# Patient Record
Sex: Female | Born: 1995 | Race: Asian | Hispanic: No | Marital: Married | Smoking: Never smoker
Health system: Southern US, Community
[De-identification: ages and names within clinical notes are randomized; demographics above are authoritative.]

## PROBLEM LIST (undated history)

## (undated) DIAGNOSIS — J45909 Unspecified asthma, uncomplicated: Secondary | ICD-10-CM

---
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
## 2017-08-21 ENCOUNTER — Emergency Department
Admission: EM | Admit: 2017-08-21 | Discharge: 2017-08-21 | Disposition: A | Discharge status: Home / Self Care | Payer: 59 | Source: Home / Self Care | Attending: Family Medicine | Admitting: Family Medicine

## 2017-08-21 ENCOUNTER — Other Ambulatory Visit: Payer: Self-pay

## 2017-08-21 ENCOUNTER — Encounter: Payer: Self-pay | Admitting: Emergency Medicine

## 2017-08-21 DIAGNOSIS — J069 Acute upper respiratory infection, unspecified: Secondary | ICD-10-CM

## 2017-08-21 DIAGNOSIS — B9789 Other viral agents as the cause of diseases classified elsewhere: Secondary | ICD-10-CM

## 2017-08-21 DIAGNOSIS — J0100 Acute maxillary sinusitis, unspecified: Secondary | ICD-10-CM | POA: Diagnosis not present

## 2017-08-21 MED ORDER — AMOXICILLIN 875 MG PO TABS: 875.0000 mg | ORAL_TABLET | Freq: Two times a day (BID) | ORAL | 0 refills | Status: DC

## 2017-08-21 MED ORDER — BENZONATATE 200 MG PO CAPS: ORAL_CAPSULE | ORAL | 0 refills | Status: AC

## 2017-08-21 MED ORDER — PREDNISONE 20 MG PO TABS: ORAL_TABLET | ORAL | 0 refills | Status: DC

## 2017-08-21 NOTE — ED Triage Notes (Signed)
Patient presents to [facility] with a complaint of Cough and Congestion

## 2017-08-21 NOTE — ED Provider Notes (Signed)
[facility]    CSN: [id number] Arrival date & time: 08/21/17  1208     History   Chief Complaint Chief Complaint  Patient presents with  . Cough    HPI Abigail Bridges is a 22 y.o. female.   About 13 days ago patient developed typical cold-like symptoms developing over several days, including mild sore throat, sinus congestion, headache, fatigue, and cough.  Patient complains of persistent sinus pressure and chills/sweats. She has a past history of exercise asthma, seasonal rhinitis, and past frequent otitis media as a child.   The history is provided by the patient.    History reviewed. No pertinent past medical history.  There are no active problems to display for this patient.   History reviewed. No pertinent surgical history.  OB History    No data available       Home Medications    Prior to Admission medications   Medication Sig Start Date End Date Taking? Authorizing Provider  amoxicillin (AMOXIL) 875 MG tablet Take 1 tablet (875 mg total) by mouth 2 (two) times daily. 08/21/17   Lattie Haw, MD  benzonatate (TESSALON) 200 MG capsule Take one cap by mouth at bedtime as needed for cough.  May repeat in 4 to 6 hours 08/21/17   Lattie Haw, MD  predniSONE (DELTASONE) 20 MG tablet Take one tab by mouth twice daily for 4 days, then one daily for 3 days. Take with food. 08/21/17   Lattie Haw, MD    Family History No family history on file.  Social History Social History   Tobacco Use  . Smoking status: Never Smoker  . Smokeless tobacco: Never Used  Substance Use Topics  . Alcohol use: Not on file  . Drug use: Not on file     Allergies   Patient has no known allergies.   Review of Systems Review of Systems + sore throat + cough No pleuritic pain but feels tight in anterior chest No wheezing + nasal congestion + post-nasal drainage + sinus pain/pressure No itchy/red eyes No earache No hemoptysis No SOB No  fever, + chills/sweats No nausea No vomiting No abdominal pain No diarrhea No urinary symptoms No skin rash + fatigue + myalgias + headache Used OTC meds without relief   Physical Exam Triage Vital Signs ED Triage Vitals  Enc Vitals Group     BP 08/21/17 1238 122/80     Pulse Rate 08/21/17 1238 100     Resp --      Temp 08/21/17 1238 98.7 F (37.1 C)     Temp Source 08/21/17 1238 Oral     SpO2 08/21/17 1238 99 %     Weight --      Height 08/21/17 1238 4\' 11"  (1.499 m)     Head Circumference --      Peak Flow --      Pain Score 08/21/17 1239 7     Pain Loc --      Pain Edu? --      Excl. in GC? --    No data found.  Updated Vital Signs BP 122/80 (BP Location: Left Arm)   Pulse 100   Temp 98.7 F (37.1 C) (Oral)   Ht 4\' 11"  (1.499 m)   SpO2 99%   Visual Acuity Right Eye Distance:   Left Eye Distance:   Bilateral Distance:    Right Eye Near:   Left Eye Near:    Bilateral Near:  Physical Exam Nursing notes and Vital Signs reviewed. Appearance:  Patient appears stated age, and in no acute distress Eyes:  Pupils are equal, round, and reactive to light and accomodation.  Extraocular movement is intact.  Conjunctivae are not inflamed  Ears:  Right canal normal but partly occluded with cerumen.  Visualized portion of right tympanic membrane appears to have serous effusion.  Left canal and tympanic membrane normal. Nose:  Congested turbinates.   Right maxillary sinus tenderness is present.  Pharynx:  Normal Neck:  Supple.  Enlarged posterior/lateral nodes are palpated bilaterally, tender to palpation on the left.   Lungs:  Clear to auscultation.  Breath sounds are equal.  Moving air well. Heart:  Regular rate and rhythm without murmurs, rubs, or gallops.  Abdomen:  Nontender without masses or hepatosplenomegaly.  Bowel sounds are present.  No CVA or flank tenderness.  Extremities:  No edema.  Skin:  No rash present.    UC Treatments / Results  Labs (all  labs ordered are listed, but only abnormal results are displayed) Labs Reviewed - No data to display  EKG  EKG Interpretation None       Radiology No results found.  Procedures Procedures (including critical care time)  Medications Ordered in UC Medications - No data to display   Initial Impression / Assessment and Plan / UC Course  I have reviewed the triage vital signs and the nursing notes.  Pertinent labs & imaging results that were available during my care of the patient were reviewed by me and considered in my medical decision making (see chart for details).    Begin amoxicillin, and prednisone burst/taper. Prescription written for Benzonatate [facility](Tessalon) to take at bedtime for night-time cough.  Take plain guaifenesin (1200mg  extended release tabs such as Mucinex) twice daily, with plenty of water, for cough and congestion.  May add Pseudoephedrine (30mg , one or two every 4 to 6 hours) for sinus congestion.  Get adequate rest.   May use Afrin nasal spray (or generic oxymetazoline) each morning for about 5 days and then discontinue.  Also recommend using saline nasal spray several times daily and saline nasal irrigation (AYR is a common brand).  Use Flonase nasal spray each morning after using Afrin nasal spray and saline nasal irrigation. Try warm salt water gargles for sore throat.  Stop all antihistamines for now, and other non-prescription cough/cold preparations. Followup with Family Doctor if not improved in one week.     Final Clinical Impressions(s) / UC Diagnoses   Final diagnoses:  Acute maxillary sinusitis, recurrence not specified  Viral URI with cough    ED Discharge Orders        Ordered    amoxicillin (AMOXIL) 875 MG tablet  2 times daily     08/21/17 1255    predniSONE (DELTASONE) 20 MG tablet     08/21/17 1255    benzonatate (TESSALON) 200 MG capsule     08/21/17 1255          Lattie HawBeese, Malosi Hemstreet A, MD 08/27/17 1449

## 2017-08-21 NOTE — Discharge Instructions (Signed)
Take plain guaifenesin (1200mg extended release tabs such as Mucinex) twice daily, with plenty of water, for cough and congestion.  May add Pseudoephedrine (30mg, one or two every 4 to 6 hours) for sinus congestion.  Get adequate rest.   °May use Afrin nasal spray (or generic oxymetazoline) each morning for about 5 days and then discontinue.  Also recommend using saline nasal spray several times daily and saline nasal irrigation (AYR is a common brand).  Use Flonase nasal spray each morning after using Afrin nasal spray and saline nasal irrigation. °Try warm salt water gargles for sore throat.  °Stop all antihistamines for now, and other non-prescription cough/cold preparations. °  °  °

## 2019-10-13 ENCOUNTER — Emergency Department (HOSPITAL_COMMUNITY): Payer: Self-pay

## 2019-10-13 ENCOUNTER — Emergency Department (HOSPITAL_COMMUNITY)
Admission: EM | Admit: 2019-10-13 | Discharge: 2019-10-13 | Disposition: A | Discharge status: Home / Self Care | Payer: Self-pay | Attending: Emergency Medicine | Admitting: Emergency Medicine

## 2019-10-13 ENCOUNTER — Ambulatory Visit (HOSPITAL_COMMUNITY)
Admission: EM | Admit: 2019-10-13 | Discharge: 2019-10-13 | Disposition: A | Discharge status: Home / Self Care | Payer: Self-pay | Attending: Family Medicine | Admitting: Family Medicine

## 2019-10-13 ENCOUNTER — Ambulatory Visit (INDEPENDENT_AMBULATORY_CARE_PROVIDER_SITE_OTHER): Payer: Self-pay

## 2019-10-13 ENCOUNTER — Emergency Department (HOSPITAL_BASED_OUTPATIENT_CLINIC_OR_DEPARTMENT_OTHER): Payer: Self-pay

## 2019-10-13 ENCOUNTER — Encounter (HOSPITAL_COMMUNITY): Payer: Self-pay | Admitting: Family Medicine

## 2019-10-13 ENCOUNTER — Encounter (HOSPITAL_COMMUNITY): Payer: Self-pay | Admitting: Emergency Medicine

## 2019-10-13 ENCOUNTER — Other Ambulatory Visit: Payer: Self-pay

## 2019-10-13 DIAGNOSIS — R0789 Other chest pain: Secondary | ICD-10-CM

## 2019-10-13 DIAGNOSIS — R0602 Shortness of breath: Secondary | ICD-10-CM | POA: Insufficient documentation

## 2019-10-13 DIAGNOSIS — R609 Edema, unspecified: Secondary | ICD-10-CM

## 2019-10-13 DIAGNOSIS — J45909 Unspecified asthma, uncomplicated: Secondary | ICD-10-CM | POA: Insufficient documentation

## 2019-10-13 HISTORY — DX: Unspecified asthma, uncomplicated: J45.909

## 2019-10-13 LAB — CBC
HCT: 46.4 % — ABNORMAL HIGH (ref 36.0–46.0)
Hemoglobin: 15.1 g/dL — ABNORMAL HIGH (ref 12.0–15.0)
MCH: 27.7 pg (ref 26.0–34.0)
MCHC: 32.5 g/dL (ref 30.0–36.0)
MCV: 85 fL (ref 80.0–100.0)
Platelets: 480 10*3/uL — ABNORMAL HIGH (ref 150–400)
RBC: 5.46 MIL/uL — ABNORMAL HIGH (ref 3.87–5.11)
RDW: 12.6 % (ref 11.5–15.5)
WBC: 7.7 10*3/uL (ref 4.0–10.5)
nRBC: 0 % (ref 0.0–0.2)

## 2019-10-13 LAB — BASIC METABOLIC PANEL
Anion gap: 13 (ref 5–15)
BUN: 10 mg/dL (ref 6–20)
CO2: 24 mmol/L (ref 22–32)
Calcium: 9.2 mg/dL (ref 8.9–10.3)
Chloride: 104 mmol/L (ref 98–111)
Creatinine, Ser: 0.66 mg/dL (ref 0.44–1.00)
GFR calc Af Amer: >60 mL/min (ref >60)
GFR calc non Af Amer: >60 mL/min (ref >60)
Glucose, Bld: 90 mg/dL (ref 70–99)
Potassium: 4 mmol/L (ref 3.5–5.1)
Sodium: 141 mmol/L (ref 135–145)

## 2019-10-13 LAB — I-STAT BETA HCG BLOOD, ED (MC, WL, AP ONLY): I-stat hCG, quantitative: <5 m[IU]/mL (ref <5)

## 2019-10-13 LAB — TROPONIN I (HIGH SENSITIVITY)
Troponin I (High Sensitivity): <2 ng/L (ref <18)
Troponin I (High Sensitivity): 3 ng/L (ref <18)

## 2019-10-13 MED ORDER — SODIUM CHLORIDE 0.9% FLUSH: 3.0000 mL | Freq: Once | INTRAVENOUS | Status: DC

## 2019-10-13 MED ORDER — AEROCHAMBER PLUS FLO-VU LARGE MISC
1.0000 | Freq: Once | Status: AC
  Administered 2019-10-13: 1

## 2019-10-13 MED ORDER — ALBUTEROL SULFATE HFA 108 (90 BASE) MCG/ACT IN AERS
2.0000 | INHALATION_SPRAY | Freq: Once | RESPIRATORY_TRACT | Status: AC
  Administered 2019-10-13: 2 via RESPIRATORY_TRACT
  Filled 2019-10-13: qty 6.7

## 2019-10-13 NOTE — ED Provider Notes (Signed)
[facility] Provider Note   CSN: [id number] Arrival date & time: 10/13/19  1020     History Chief Complaint  Patient presents with  . Chest Pain  . Shortness of Breath    Abigail RainwaterCarmen Daisy Bridges is a 24 y.o. female with past medical history of anemia, who presents today for evaluation of left-sided chest pain. She reports that initially when she woke up at about 730 this morning she was feeling okay however when she went to get up out of bed she had sudden onset of severe left-sided chest pain with nausea.  She reports that it hurts whenever she breathes therefore she was taking small shallow breaths however denies any difficulty getting air in or out. She denies any recent trauma.  She does not take any hormones or birth control.  She does report that occasionally her right ankle swells which is been relatively new for her.  She denies any recent new activities.  She reports that she tested positive for Covid in November and has fully recovered since then.  She denies any history of DVT/PE.  She initially went to urgent care where they were reportedly concerned about a blood clot and referred her here.    HPI     Past Medical History:  Diagnosis Date  . Anemia 2014  . Asthma     There are no problems to display for this patient.   History reviewed. No pertinent surgical history.   OB History   No obstetric history on file.     Family History  Problem Relation Age of Onset  . Healthy Mother   . Healthy Father     Social History   Tobacco Use  . Smoking status: Never Smoker  . Smokeless tobacco: Never Used  Substance Use Topics  . Alcohol use: Yes    Comment: occasional   . Drug use: Never    Home Medications Prior to Admission medications   Medication Sig Start Date End Date Taking? Authorizing Provider  albuterol (VENTOLIN HFA) 108 (90 Base) MCG/ACT inhaler Inhale 2 puffs into the lungs every 6 (six) hours as needed  for wheezing or shortness of breath.    [provider]  benzonatate (TESSALON) 200 MG capsule Take one cap by mouth at bedtime as needed for cough.  May repeat in 4 to 6 hours 08/21/17   Lattie HawBeese, Stephen A, MD    Allergies    Prednisone  Review of Systems   Review of Systems  Constitutional: Negative for chills, fatigue and fever.  HENT: Negative for congestion.   Eyes: Negative for visual disturbance.  Respiratory: Positive for cough. Negative for shortness of breath (Chest pain with breathing).   Cardiovascular: Positive for chest pain and leg swelling. Negative for palpitations.  Gastrointestinal: Positive for nausea. Negative for abdominal pain, diarrhea and vomiting.  Genitourinary: Negative for dysuria and urgency.  Musculoskeletal: Negative for back pain and neck pain.  Neurological: Positive for headaches. Negative for weakness and light-headedness.  All other systems reviewed and are negative.   Physical Exam Updated Vital Signs BP 105/77   Pulse 86   Temp 99 F (37.2 C) (Oral)   Resp 18   Ht 4\' 11"  (1.499 m)   Wt 86.2 kg   SpO2 98%   BMI 38.38 kg/m   Physical Exam Vitals and nursing note reviewed.  Constitutional:      General: She is not in acute distress.    Appearance: She is well-developed.  HENT:     Head: Normocephalic and atraumatic.  Eyes:     Conjunctiva/sclera: Conjunctivae normal.  Cardiovascular:     Rate and Rhythm: Normal rate and regular rhythm.     Pulses:          Radial pulses are 2+ on the right side and 2+ on the left side.       Dorsalis pedis pulses are 2+ on the right side and 2+ on the left side.       Posterior tibial pulses are 2+ on the right side and 2+ on the left side.     Heart sounds: Normal heart sounds. No murmur.  Pulmonary:     Effort: Pulmonary effort is normal. No respiratory distress.     Breath sounds: Normal breath sounds.  Chest:     Chest wall: Tenderness (There is generalized tenderness to palpation  over the left anterior chest. Palpation here both recreates and exacerbates her reported pain. Additionally she has tenderness to palpation over the left-sided superior aspect of the trapezius muscle.  ) present.     Comments: Chest pain is recreated and exacerbated with abduction, external rotation of the left arm. Abdominal:     Palpations: Abdomen is soft.     Tenderness: There is no abdominal tenderness.  Musculoskeletal:     Cervical back: Normal range of motion and neck supple.     Right lower leg: No tenderness. Edema (Trace around right ankle) present.     Left lower leg: No tenderness. No edema.  Skin:    General: Skin is warm and dry.  Neurological:     General: No focal deficit present.     Mental Status: She is alert.  Psychiatric:        Mood and Affect: Mood normal.        Behavior: Behavior normal.     ED Results / Procedures / Treatments   Labs (all labs ordered are listed, but only abnormal results are displayed) Labs Reviewed  CBC - Abnormal; Notable for the following components:      Result Value   RBC 5.46 (*)    Hemoglobin 15.1 (*)    HCT 46.4 (*)    Platelets 480 (*)    All other components within normal limits  BASIC METABOLIC PANEL  I-STAT BETA HCG BLOOD, ED ([facility], [facility], [facility] ONLY)  TROPONIN I (HIGH SENSITIVITY)  TROPONIN I (HIGH SENSITIVITY)    EKG None  Radiology DG Chest 2 View  Result Date: 10/13/2019 CLINICAL DATA:  Chest pain, shortness of breath EXAM: CHEST - 2 VIEW COMPARISON:  10/13/2019, 9:41 a.m. FINDINGS: The heart size and mediastinal contours are within normal limits. Both lungs are clear. The visualized skeletal structures are unremarkable. IMPRESSION: No acute abnormality of the lungs. There is no airspace abnormality of the left upper lobe underlying hair braids seen on prior examination. Electronically Signed   By: Lauralyn Primes M.D.   On: 10/13/2019 11:07   DG Chest 2 View  Addendum Date: 10/13/2019   ADDENDUM REPORT: 10/13/2019 10:01  ADDENDUM: Repeat frontal view with hair braids moved shows lungs clear bilaterally. Electronically Signed   By: Bretta Bang III M.D.   On: 10/13/2019 10:01   Result Date: 10/13/2019 CLINICAL DATA:  Chest pain EXAM: CHEST - 2 VIEW COMPARISON:  None. FINDINGS: Apparent air braids are noted on the left which extend over a portion of the left upper chest. It is difficult to exclude patchy infiltrate in this area, although  given normal lateral view, the opacity in the left upper lobe is likely artifactual as opposed to arising from within the lung parenchyma. Lungs elsewhere clear. Heart size and pulmonary vascularity are normal. No adenopathy. No bone lesions. IMPRESSION: Suspect artifact overlying the left upper lobe; it may be prudent to repeat frontal view after moving hair braids from left side. Lungs elsewhere clear. Cardiac silhouette normal. No adenopathy. These results will be called to the ordering clinician or representative by the Radiologist Assistant, and communication documented in the PACS or [employer]. Electronically Signed: By: Bretta Bang III M.D. On: 10/13/2019 09:48   VAS [place] LOWER EXTREMITY VENOUS (DVT) (ONLY [facility] & [facility] 7a-7p)  Result Date: 10/13/2019  Lower Venous DVTStudy Indications: Edema.  Comparison Study: no prior Performing Technologist: Blanch Media RVS  Examination Guidelines: A complete evaluation includes B-mode imaging, spectral Doppler, color Doppler, and power Doppler as needed of all accessible portions of each vessel. Bilateral testing is considered an integral part of a complete examination. Limited examinations for reoccurring indications may be performed as noted. The reflux portion of the exam is performed with the patient in reverse Trendelenburg.  +---------+---------------+---------+-----------+----------+--------------+ RIGHT    CompressibilityPhasicitySpontaneityPropertiesThrombus Aging  +---------+---------------+---------+-----------+----------+--------------+ CFV      Full           Yes      Yes                                 +---------+---------------+---------+-----------+----------+--------------+ SFJ      Full                                                        +---------+---------------+---------+-----------+----------+--------------+ FV Prox  Full                                                        +---------+---------------+---------+-----------+----------+--------------+ FV Mid   Full                                                        +---------+---------------+---------+-----------+----------+--------------+ FV DistalFull                                                        +---------+---------------+---------+-----------+----------+--------------+ PFV      Full                                                        +---------+---------------+---------+-----------+----------+--------------+ POP      Full           Yes      Yes                                 +---------+---------------+---------+-----------+----------+--------------+  PTV      Full                                                        +---------+---------------+---------+-----------+----------+--------------+ PERO     Full                                                        +---------+---------------+---------+-----------+----------+--------------+   +----+---------------+---------+-----------+----------+--------------+ LEFTCompressibilityPhasicitySpontaneityPropertiesThrombus Aging +----+---------------+---------+-----------+----------+--------------+ CFV Full           Yes      Yes                                 +----+---------------+---------+-----------+----------+--------------+     Summary: RIGHT: - There is no evidence of deep vein thrombosis in the lower extremity.  - No cystic structure found in the popliteal fossa.   LEFT: - No evidence of common femoral vein obstruction.  *See table(s) above for measurements and observations.    Preliminary     Procedures Procedures (including critical care time)  Medications Ordered in ED Medications  sodium chloride flush (NS) 0.9 % injection 3 mL (3 mLs Intravenous Not Given 10/13/19 1237)  albuterol (VENTOLIN HFA) 108 (90 Base) MCG/ACT inhaler 2 puff (2 puffs Inhalation Given 10/13/19 1235)  AeroChamber Plus Flo-Vu Large MISC 1 each (1 each Other Given 10/13/19 1236)    ED Course  I have reviewed the triage vital signs and the nursing notes.  Pertinent labs & imaging results that were available during my care of the patient were reviewed by me and considered in my medical decision making (see chart for details).  Clinical Course as of Oct 12 1437  Fri Oct 13, 2019  1434 Patient reevaluated, she reports the albuterol "helped a little bit." No changes in symptoms otherwise   [EH]    Clinical Course User Index [EH] Ollen Gross   MDM Rules/Calculators/A&P                     Patient is a 24 year old woman who is overall healthy who presents today from urgent care for concern of chest pain and possible DVT/PE. On my exam she is resting comfortably in no obvious distress. She is not consistently tachycardic or tachypneic. She does report occasional ankle swelling and there is questionable/trace edema around the right ankle. DVT study was ordered without evidence of right leg DVT. Given that she is otherwise PERC negative no indication for further evaluation of VTE at this time especially given low clinical suspicion. She does not take any hormones. She did have Covid in November however has fully recovered since then. Troponin x2 obtained without evidence of ischemia. EKG without evidence of ischemia or acute arrhythmia. She was given albuterol as she has previous prescription for that at home which provided minimal relief. Her pain is easily recreated  and exacerbated with left arm motion and palpation of the left anterior chest wall. I suspect that her chest pain is musculoskeletal in origin. Chest x-ray without evidence of consolidation, pneumothorax, or other acute abnormalities. She was observed in the emergency  room for 4 hours without significant change in condition. Recommended OTC ibuprofen and Tylenol as needed for symptoms. She does appear minimally dehydrated and we discussed the importance of p.o. hydration.  Return precautions were discussed with patient who states their understanding.  At the time of discharge patient denied any unaddressed complaints or concerns.  Patient is agreeable for discharge home.  Note: Portions of this report may have been transcribed using voice recognition software. Every effort was made to ensure accuracy; however, inadvertent computerized transcription errors may be present  Final Clinical Impression(s) / ED Diagnoses Final diagnoses:  Atypical chest pain    Rx / DC Orders ED Discharge Orders    None       Norman Clay 10/13/19 1940    Benjiman Core, MD 10/16/19 [phone]

## 2019-10-13 NOTE — ED Triage Notes (Signed)
Pt presents with c/o sharp L CP radiating to left shoulder, which woke her up at 0730.  Denies SOB, dizziness, nausea.  States pain is worse when pressing on chest wall and with deep inspiration.

## 2019-10-13 NOTE — ED Notes (Signed)
Ultrasound currently at bedside.

## 2019-10-13 NOTE — ED Notes (Signed)
Patient is being discharged from the Urgent Care Center and sent to the Emergency Department via wheelchair by staff. Per Dr. Milus Glazier, patient is stable but in need of higher level of care due to chest pain. Patient is aware and verbalizes understanding of plan of care.  Vitals:   10/13/19 0918  BP: 105/61  Pulse: 90  Resp: 20  Temp: 98.7 F (37.1 C)  SpO2: 100%

## 2019-10-13 NOTE — Progress Notes (Signed)
Lower extremity venous has been completed.   Preliminary results in CV Proc.   Blanch Media 10/13/2019 1:24 PM

## 2019-10-13 NOTE — ED Notes (Signed)
ED Provider at bedside. 

## 2019-10-13 NOTE — Discharge Instructions (Addendum)
Because of the possibility that this pain could be from a clot in the lung, I am sending you down to the emergency department for further testing

## 2019-10-13 NOTE — ED Provider Notes (Signed)
[facility]    CSN: [id number] Arrival date & time: 10/13/19  0847      History   Chief Complaint Chief Complaint  Patient presents with  . Chest Pain    HPI Abigail Bridges is a 24 y.o. female.   Patient initial Abigail Bridges urgent care visit for this 24 year old woman who presents with left-sided chest pain.  She states that about 730 this morning she suddenly felt anterior left upper chest pain without shortness of breath.  The pain has intensified somewhat.  It is worse with taking a deep breath.  It is also worse when pressing on that upper left anterior section of her chest.  Patient is never had this before     Past Medical History:  Diagnosis Date  . Anemia 2014  . Asthma     There are no problems to display for this patient.   History reviewed. No pertinent surgical history.  OB History   No obstetric history on file.      Home Medications    Prior to Admission medications   Medication Sig Start Date End Date Taking? Authorizing Provider  albuterol (VENTOLIN HFA) 108 (90 Base) MCG/ACT inhaler Inhale 2 puffs into the lungs every 6 (six) hours as needed for wheezing or shortness of breath.   Yes [provider]  benzonatate (TESSALON) 200 MG capsule Take one cap by mouth at bedtime as needed for cough.  May repeat in 4 to 6 hours 08/21/17   Lattie Haw, MD    Family History Family History  Problem Relation Age of Onset  . Healthy Mother   . Healthy Father     Social History Social History   Tobacco Use  . Smoking status: Never Smoker  . Smokeless tobacco: Never Used  Substance Use Topics  . Alcohol use: Yes    Comment: occasional   . Drug use: Never     Allergies   Prednisone   Review of Systems Review of Systems   Physical Exam Triage Vital Signs ED Triage Vitals [10/13/19 0910]  Enc Vitals Group     BP      Pulse      Resp      Temp      Temp src      SpO2      Weight      Height      Head  Circumference      Peak Flow      Pain Score 10     Pain Loc      Pain Edu?      Excl. in GC?    No data found.  Updated Vital Signs BP 105/61 (BP Location: Right Arm)   Pulse 90   Temp 98.7 F (37.1 C) (Oral)   Resp 20   LMP 10/05/2018 Comment: patient stated she doesn't have periods  SpO2 100%    Physical Exam Vitals and nursing note reviewed.  Constitutional:      General: She is in acute distress.     Appearance: She is well-developed. She is obese.  HENT:     Head: Normocephalic.  Eyes:     Pupils: Pupils are equal, round, and reactive to light.  Cardiovascular:     Rate and Rhythm: Normal rate and regular rhythm.     Heart sounds: Normal heart sounds.  Pulmonary:     Effort: Pulmonary effort is normal.     Breath sounds: Normal breath sounds.  Comments: Tender left upper chest with palpation and tender posterior left upper x. Abdominal:     Palpations: Abdomen is soft.  Musculoskeletal:        General: Normal range of motion.     Cervical back: Normal range of motion and neck supple.  Skin:    General: Skin is warm and dry.  Neurological:     General: No focal deficit present.     Mental Status: She is alert.  Psychiatric:        Mood and Affect: Mood normal.        Behavior: Behavior normal.      UC Treatments / Results  Labs (all labs ordered are listed, but only abnormal results are displayed) Labs Reviewed - No data to display  EKG   Radiology DG Chest 2 View  Addendum Date: 10/13/2019   ADDENDUM REPORT: 10/13/2019 10:01 ADDENDUM: Repeat frontal view with hair braids moved shows lungs clear bilaterally. Electronically Signed   By: Lowella Grip III M.D.   On: 10/13/2019 10:01   Result Date: 10/13/2019 CLINICAL DATA:  Chest pain EXAM: CHEST - 2 VIEW COMPARISON:  None. FINDINGS: Apparent air braids are noted on the left which extend over a portion of the left upper chest. It is difficult to exclude patchy infiltrate in this area,  although given normal lateral view, the opacity in the left upper lobe is likely artifactual as opposed to arising from within the lung parenchyma. Lungs elsewhere clear. Heart size and pulmonary vascularity are normal. No adenopathy. No bone lesions. IMPRESSION: Suspect artifact overlying the left upper lobe; it may be prudent to repeat frontal view after moving hair braids from left side. Lungs elsewhere clear. Cardiac silhouette normal. No adenopathy. These results will be called to the ordering clinician or representative by the Radiologist Assistant, and communication documented in the PACS or [employer]. Electronically Signed: By: Lowella Grip III M.D. On: 10/13/2019 09:48    Procedures Procedures (including critical care time)  Medications Ordered in UC Medications - No data to display  Initial Impression / Assessment and Plan / UC Course  I have reviewed the triage vital signs and the nursing notes.  Pertinent labs & imaging results that were available during my care of the patient were reviewed by me and considered in my medical decision making (see chart for details).    Final Clinical Impressions(s) / UC Diagnoses   Final diagnoses:  Atypical chest pain     Discharge Instructions     Because of the possibility that this pain could be from a clot in the lung, I am sending you down to the emergency department for further testing    ED Prescriptions    None     I have reviewed the PDMP during this encounter.   Robyn Haber, MD 10/13/19 1005

## 2019-10-13 NOTE — ED Triage Notes (Signed)
Pt from UC with c/o shortness of breath and chest pain. She is being sent here for ? Blood clot. A/O X4. Reports lightlessness and nausea.

## 2019-10-13 NOTE — Discharge Instructions (Addendum)

## 2019-11-22 ENCOUNTER — Emergency Department (HOSPITAL_COMMUNITY)
Admission: EM | Admit: 2019-11-22 | Discharge: 2019-11-23 | Disposition: A | Discharge status: Home / Self Care | Payer: Self-pay | Attending: Emergency Medicine | Admitting: Emergency Medicine

## 2019-11-22 ENCOUNTER — Other Ambulatory Visit: Payer: Self-pay

## 2019-11-22 ENCOUNTER — Encounter (HOSPITAL_COMMUNITY): Payer: Self-pay | Admitting: Emergency Medicine

## 2019-11-22 DIAGNOSIS — J45909 Unspecified asthma, uncomplicated: Secondary | ICD-10-CM | POA: Insufficient documentation

## 2019-11-22 DIAGNOSIS — R112 Nausea with vomiting, unspecified: Secondary | ICD-10-CM | POA: Insufficient documentation

## 2019-11-22 DIAGNOSIS — R1013 Epigastric pain: Secondary | ICD-10-CM | POA: Insufficient documentation

## 2019-11-22 LAB — URINALYSIS, ROUTINE W REFLEX MICROSCOPIC
Bacteria, UA: NONE SEEN
Bilirubin Urine: NEGATIVE
Glucose, UA: NEGATIVE mg/dL
Ketones, ur: NEGATIVE mg/dL
Leukocytes,Ua: NEGATIVE
Nitrite: NEGATIVE
Protein, ur: NEGATIVE mg/dL
Specific Gravity, Urine: 1.026 (ref 1.005–1.030)
pH: 6 (ref 5.0–8.0)

## 2019-11-22 LAB — COMPREHENSIVE METABOLIC PANEL
ALT: 26 U/L (ref 0–44)
AST: 21 U/L (ref 15–41)
Albumin: 4.2 g/dL (ref 3.5–5.0)
Alkaline Phosphatase: 56 U/L (ref 38–126)
Anion gap: 9 (ref 5–15)
BUN: 11 mg/dL (ref 6–20)
CO2: 26 mmol/L (ref 22–32)
Calcium: 9.1 mg/dL (ref 8.9–10.3)
Chloride: 104 mmol/L (ref 98–111)
Creatinine, Ser: 0.73 mg/dL (ref 0.44–1.00)
GFR calc Af Amer: >60 mL/min (ref >60)
GFR calc non Af Amer: >60 mL/min (ref >60)
Glucose, Bld: 91 mg/dL (ref 70–99)
Potassium: 3.4 mmol/L — ABNORMAL LOW (ref 3.5–5.1)
Sodium: 139 mmol/L (ref 135–145)
Total Bilirubin: 0.7 mg/dL (ref 0.3–1.2)
Total Protein: 8 g/dL (ref 6.5–8.1)

## 2019-11-22 LAB — CBC
HCT: 43.2 % (ref 36.0–46.0)
Hemoglobin: 14.1 g/dL (ref 12.0–15.0)
MCH: 27.8 pg (ref 26.0–34.0)
MCHC: 32.6 g/dL (ref 30.0–36.0)
MCV: 85 fL (ref 80.0–100.0)
Platelets: 441 10*3/uL — ABNORMAL HIGH (ref 150–400)
RBC: 5.08 MIL/uL (ref 3.87–5.11)
RDW: 12.1 % (ref 11.5–15.5)
WBC: 9.2 10*3/uL (ref 4.0–10.5)
nRBC: 0 % (ref 0.0–0.2)

## 2019-11-22 LAB — LIPASE, BLOOD: Lipase: 31 U/L (ref 11–51)

## 2019-11-22 LAB — I-STAT BETA HCG BLOOD, ED (MC, WL, AP ONLY): I-stat hCG, quantitative: <5 m[IU]/mL (ref <5)

## 2019-11-22 MED ORDER — SODIUM CHLORIDE 0.9% FLUSH: 3.0000 mL | Freq: Once | INTRAVENOUS | Status: DC

## 2019-11-22 NOTE — ED Triage Notes (Addendum)
Patient reports mid abdominal pain with bloody emesis onset last night , no emesis at arrival , denies fever or diarrhea .

## 2019-11-23 ENCOUNTER — Emergency Department (HOSPITAL_COMMUNITY): Payer: Self-pay

## 2019-11-23 MED ORDER — ONDANSETRON 4 MG PO TBDP: 4.0000 mg | ORAL_TABLET | Freq: Three times a day (TID) | ORAL | 0 refills | Status: AC | PRN

## 2019-11-23 MED ORDER — ONDANSETRON 4 MG PO TBDP
8.0000 mg | ORAL_TABLET | Freq: Once | ORAL | Status: AC
  Administered 2019-11-23: 8 mg via ORAL
  Filled 2019-11-23: qty 2

## 2019-11-23 NOTE — Discharge Instructions (Signed)
Take the prescribed medication as directed.  Try and push oral fluids.  Gentle diet for now, progress as tolerated. Follow-up with your primary care doctor. Return to the ED for new or worsening symptoms.

## 2019-11-23 NOTE — ED Notes (Signed)
All discharge instructions reviewed with pt. Pt verbalized understanding

## 2019-11-23 NOTE — ED Provider Notes (Signed)
[facility] EMERGENCY DEPARTMENT Provider Note   CSN: [id number] Arrival date & time: 11/22/19  1905     History Chief Complaint  Patient presents with  . Abdominal Pain  . Hematemesis    Abigail Bridges is a 24 y.o. female.  The history is provided by the patient and medical records.  Abdominal Pain Associated symptoms: nausea and vomiting     24 y.o. F with hx of asthma, anemia, presenting to the ED for abdominal pain.  States has been epigastric in location with a little bit into right side  She denies urinary symptoms, fever, chills, sweats, or diarrhea.  Has had some ongoing nausea/vomiting-- last emesis about 4 hours ago.  Has not been able to eat/drink over the past 12+ hours or so-- the last thing she was able to eat was avocado.  She has tried small sips of water, unable to hold it down.  She has not had any medications for her symptoms PTA.  Denies sick contacts or covid exposures.    Past Medical History:  Diagnosis Date  . Anemia 2014  . Asthma     There are no problems to display for this patient.   History reviewed. No pertinent surgical history.   OB History   No obstetric history on file.     Family History  Problem Relation Age of Onset  . Healthy Mother   . Healthy Father     Social History   Tobacco Use  . Smoking status: Never Smoker  . Smokeless tobacco: Never Used  Substance Use Topics  . Alcohol use: Yes    Comment: occasional   . Drug use: Never    Home Medications Prior to Admission medications   Medication Sig Start Date End Date Taking? Authorizing Provider  albuterol (VENTOLIN HFA) 108 (90 Base) MCG/ACT inhaler Inhale 2 puffs into the lungs every 6 (six) hours as needed for wheezing or shortness of breath.    [provider]  benzonatate (TESSALON) 200 MG capsule Take one cap by mouth at bedtime as needed for cough.  May repeat in 4 to 6 hours 08/21/17   Kandra Nicolas, MD    Allergies      Prednisone  Review of Systems   Review of Systems  Gastrointestinal: Positive for abdominal pain, nausea and vomiting.  All other systems reviewed and are negative.   Physical Exam Updated Vital Signs BP 116/71 (BP Location: Left Arm)   Pulse 84   Temp 98.2 F (36.8 C) (Oral)   Resp 18   Ht 4\' 11"  (1.499 m)   Wt 90 kg   SpO2 98%   BMI 40.07 kg/m   Physical Exam Vitals and nursing note reviewed.  Constitutional:      Appearance: She is well-developed.     Comments: Sleeping, awoken for exam  HENT:     Head: Normocephalic and atraumatic.  Eyes:     Conjunctiva/sclera: Conjunctivae normal.     Pupils: Pupils are equal, round, and reactive to light.  Cardiovascular:     Rate and Rhythm: Normal rate and regular rhythm.     Heart sounds: Normal heart sounds.  Pulmonary:     Effort: Pulmonary effort is normal.     Breath sounds: Normal breath sounds.  Abdominal:     General: Bowel sounds are normal.     Palpations: Abdomen is soft.     Tenderness: There is abdominal tenderness (mild) in the epigastric area. There is no rebound.  Musculoskeletal:        General: Normal range of motion.     Cervical back: Normal range of motion.  Skin:    General: Skin is warm and dry.  Neurological:     Mental Status: She is alert and oriented to person, place, and time.     ED Results / Procedures / Treatments   Labs (all labs ordered are listed, but only abnormal results are displayed) Labs Reviewed  COMPREHENSIVE METABOLIC PANEL - Abnormal; Notable for the following components:      Result Value   Potassium 3.4 (*)    All other components within normal limits  CBC - Abnormal; Notable for the following components:   Platelets 441 (*)    All other components within normal limits  URINALYSIS, ROUTINE W REFLEX MICROSCOPIC - Abnormal; Notable for the following components:   Hgb urine dipstick SMALL (*)    All other components within normal limits  LIPASE, BLOOD  I-STAT BETA  HCG BLOOD, ED ([facility], [facility], [facility] ONLY)    EKG None  Radiology US Abdomen Limited RUQ  Result Date: 11/23/2019 CLINICAL DATA:  Epigastric pain for 1 day. Right upper quadrant pain. EXAM: ULTRASOUND ABDOMEN LIMITED RIGHT UPPER QUADRANT COMPARISON:  None. FINDINGS: Gallbladder: Physiologically distended. No gallstones or wall thickening visualized. No sonographic Murphy sign noted by sonographer. Common bile duct: Diameter: 5 mm. Liver: No focal lesion identified. Heterogeneously increased in parenchymal echogenicity. Portal vein is patent on color Doppler imaging with normal direction of blood flow towards the liver. Other: No right upper quadrant free fluid. IMPRESSION: 1. Normal sonographic appearance of gallbladder and biliary tree. 2. Hepatic steatosis. Electronically Signed   By: Narda Rutherford M.D.   On: 11/23/2019 02:28    Procedures Procedures (including critical care time)  Medications Ordered in ED Medications  sodium chloride flush (NS) 0.9 % injection 3 mL (3 mLs Intravenous Not Given 11/23/19 0007)  ondansetron (ZOFRAN-ODT) disintegrating tablet 8 mg (8 mg Oral Given 11/23/19 0007)    ED Course  I have reviewed the triage vital signs and the nursing notes.  Pertinent labs & imaging results that were available during my care of the patient were reviewed by me and considered in my medical decision making (see chart for details).    MDM Rules/Calculators/A&P  24 year old female presenting to the ED with epigastric abdominal pain, nausea, and vomiting over the past 24 hours.  Last emesis about 4 hours prior to evaluation.  Patient is afebrile and nontoxic in appearance she does have some mild tenderness in the epigastrium but no peritoneal signs.  No rebound or guarding.  Does report she has had some pain on her right side as well.  Labs are grossly reassuring.  Right upper quadrant ultrasound obtained, no acute findings.  Patient has tolerated full cup of water after zofran, now  sleeping soundly.  No active emesis here in ED.   May be viral process.  Feel she is stable for discharge home with symptomatic care.  Encouraged good oral hydration.  Close follow-up with PCP.  Return here for any new/acute changes.  Final Clinical Impression(s) / ED Diagnoses Final diagnoses:  Epigastric pain  Non-intractable vomiting with nausea, unspecified vomiting type    Rx / DC Orders ED Discharge Orders         Ordered    ondansetron (ZOFRAN ODT) 4 MG disintegrating tablet  Every 8 hours PRN     11/23/19 0333  Garlon Hatchet, PA-C 11/23/19 4599    Nicanor Alcon, April, MD 11/23/19 [phone]

## 2019-12-26 ENCOUNTER — Ambulatory Visit (HOSPITAL_COMMUNITY)
Admission: AD | Admit: 2019-12-26 | Discharge: 2019-12-26 | Disposition: A | Discharge status: Home / Self Care | Payer: BC Managed Care – PPO | Attending: Psychiatry | Admitting: Psychiatry

## 2019-12-26 DIAGNOSIS — Z1330 Encounter for screening examination for mental health and behavioral disorders, unspecified: Secondary | ICD-10-CM | POA: Diagnosis present

## 2019-12-26 NOTE — BH Assessment (Signed)
Assessment Note  Abigail Bridges is an 24 y.o. female who presented to [facility] as a walk-in referred by her MD after triggering a response for suicidal ideation on a depression screening form.  Patient states that she has a history of self-mutilation by cutting in an attempt to kill herself and she states that she was hospitalized at [facility].  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  Patient states that she is starting to see that she is beginning to fall back into her depression and states that she needs to get some help.  She states that she has been hesitant to get help because the last time that she did, her job found out that she was hospitalized and fired her and she states that she has been hesitant to get help fearing that she will lose her job again. Patient denies current SI/HI/Psychosis.  Patient states that the last time she felt suicidal was approximately one month ago, but she did not have a plan.  Patient states that she has never been on an inpatient unit before and states that she has no current provider.  Patient states that she has not been sleeping well lately and states that her appetite has not been the best, but she denies that she states that she has not lost any weight.  Patient states that she has a few mixed drinks on occasion, but denies problematic use.  Patient states that she has little support from her family and states that she is essentially homeless and couch surfing.  Patient states that she is separated and states that she has no children.  Patient states that she is currently working as a Lawyer at [employer].  Patient presents as alert and oriented, her mood is depressed and her affect flat, but is able to contract for safety.  Her insight, memory and impulse control are partially impaired.  Her thoughts are organized.  She does not appear to be responding to internal  stimuli.                                   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  Past Medical History:  Past Medical History:  Diagnosis Date  . Anemia 2014  . Asthma     No past surgical history on file.  Family History:  Family History  Problem Relation Age of Onset  . Healthy Mother   . Healthy Father     Social History:  reports that she has never smoked. She has never used smokeless tobacco. She reports current alcohol use. She reports that she does not use drugs.  Additional Social History:  Alcohol / Drug Use Pain Medications: see MAR Prescriptions: see MAR Over the Counter: see MAR History of alcohol / drug use?: Yes Longest period of sobriety (when/how long): NA Substance #1 Name of Substance 1: alcohol 1 - Age of First Use: 18 1 - Amount (size/oz): few drinks 1 - Frequency: on occasion 1 - Duration: since onset 1 - Last Use / Amount: last week  CIWA: CIWA-Ar BP: 121/81 Pulse Rate: (!) 110 COWS:    Allergies:  Allergies  Allergen Reactions  . Shellfish Allergy Hives  . Prednisone Nausea And Vomiting and Rash    Home Medications: (Not in a hospital admission)  OB/GYN Status:  No LMP recorded. (Menstrual status: Irregular Periods).  General Assessment Data Location of Assessment: [facility] Assessment Services TTS Assessment: In system Is this a Tele or Face-to-Face Assessment?: Face-to-Face Is this an Initial Assessment or a Re-assessment for this encounter?: Initial Assessment Patient Accompanied by:: N/A Language Other than English: No Living Arrangements: Other (Comment)(couch surfing with friends) What gender do you identify as?: Female Marital status: Separated Pregnancy Status: No Living Arrangements: Non-relatives/Friends Can pt return to current living arrangement?: Yes Admission Status: Voluntary Is patient capable of signing voluntary admission?: Yes Referral Source: MD Insurance type: BCBS-Anthem  Medical  Screening Exam [facility] Walk-in ONLY) Medical Exam completed: Yes  Crisis Care Plan Living Arrangements: Non-relatives/Friends Legal Guardian: Other:(self) Name of Psychiatrist: none Name of Therapist: none  Education Status Is patient currently in school?: No Is the patient employed, unemployed or receiving disability?: Unemployed  Risk to self with the past 6 months Suicidal Ideation: Yes-Currently Present Has patient been a risk to self within the past 6 months prior to admission? : No Suicidal Intent: No Has patient had any suicidal intent within the past 6 months prior to admission? : No Is patient at risk for suicide?: No Suicidal Plan?: No Has patient had any suicidal plan within the past 6 months prior to admission? : No Access to Means: No What has been your use of drugs/alcohol within the last 12 months?: occasional alcohol Previous Attempts/Gestures: Yes How many times?: 1 Other Self Harm Risks: essentially homeless Triggers for Past Attempts: Unpredictable Intentional Self Injurious Behavior: Cutting Comment - Self Injurious Behavior: age 81 Family Suicide History: No Recent stressful life event(s): Divorce, Surveyor, quantity Problems, Other (Comment)(essentially homeless) Persecutory voices/beliefs?: No Depression: Yes Depression Symptoms: Despondent, Insomnia, Isolating, Loss of interest in usual pleasures, Feeling worthless/self pity Substance abuse history and/or treatment for substance abuse?: Yes Suicide prevention information given to non-admitted patients: Yes  Risk to Others within the past 6 months Homicidal Ideation: No Does patient have any lifetime risk of violence toward others beyond the six months prior to admission? : No Thoughts of Harm to Others: No Current Homicidal Intent: No Current Homicidal Plan: No Access to Homicidal Means: No Identified Victim: none History of harm to others?: No Assessment of Violence: None Noted Violent Behavior Description:  none Does patient have access to weapons?: No Criminal Charges Pending?: No Does patient have a court date: No Is patient on probation?: No  Psychosis Hallucinations: None noted Delusions: None noted  Mental Status Report Appearance/Hygiene: Unremarkable Eye Contact: Good Motor Activity: Freedom of movement Speech: Logical/coherent Level of Consciousness: Alert Mood: Depressed, Anxious Affect: Flat Anxiety Level: Moderate Thought Processes: Coherent, Relevant Judgement: Partial Orientation: Person, Place, Time, Situation Obsessive Compulsive Thoughts/Behaviors: None  Cognitive Functioning Concentration: Decreased Memory: Recent Intact, Remote Intact Is patient IDD: No Insight: Fair Impulse Control: Fair Appetite: Fair Have you had any weight changes? : No Change Sleep: Decreased Total Hours of Sleep: 5 Vegetative Symptoms: None  ADLScreening [facility] Assessment Services) Patient's cognitive ability adequate to safely complete daily activities?: Yes Patient able to express need for assistance with ADLs?: Yes Independently performs ADLs?: Yes (appropriate for developmental age)  Prior Inpatient Therapy Prior Inpatient Therapy: Yes Prior Therapy Dates: 5 years ago Prior Therapy Facilty/Provider(s): [facility] Reason for Treatment: suicidal  Prior Outpatient Therapy Prior Outpatient Therapy: No Does patient have an ACCT team?: No Does patient have Intensive In-House Services?  : No Does patient have [facility] services? : No Does patient have P4CC services?: No  ADL Screening (condition at time of admission) Patient's cognitive ability adequate to safely complete daily activities?: Yes Is the patient deaf or have difficulty hearing?: No Does the patient have difficulty seeing, even when wearing glasses/contacts?: No Does the patient have difficulty concentrating, remembering, or making decisions?: No Patient able to express need for assistance with ADLs?: Yes Does  the patient have difficulty dressing or bathing?: No Independently performs ADLs?: Yes (appropriate for developmental age) Does the patient have difficulty walking or climbing stairs?: No Weakness of Legs: None  Home Assistive Devices/Equipment Home Assistive Devices/Equipment: None  Therapy Consults (therapy consults require a physician order) PT Evaluation Needed: No OT Evalulation Needed: No SLP Evaluation Needed: No Abuse/Neglect Assessment (Assessment to be complete while patient is alone) Abuse/Neglect Assessment Can Be Completed: Yes Physical Abuse: Denies Verbal Abuse: Denies Sexual Abuse: Denies Exploitation of patient/patient's resources: Denies Self-Neglect: Denies Values / Beliefs Spiritual Requests During Hospitalization: None Consults Spiritual Care Consult Needed: No Transition of Care Team Consult Needed: No Advance Directives (For Healthcare) Does Patient Have a Medical Advance Directive?: No Would patient like information on creating a medical advance directive?: No - Patient declined Nutrition Screen- [facility] Has the patient recently lost weight without trying?: No Has the patient been eating poorly because of a decreased appetite?: No Malnutrition Screening Tool Score: 0        Disposition: Per Marvia Pickles, NP, patient does not meet inpatient criteria and can follow-up with outpatient providers. Disposition Initial Assessment Completed for this Encounter: Yes Disposition of Patient: Discharge Patient refused recommended treatment: No Mode of transportation if patient is discharged/movement?: Car Patient referred to: Outpatient clinic referral  On Site Evaluation by:   Reviewed with Physician:    Judeth Porch Tredarius Cobern 12/26/2019 3:24 PM

## 2019-12-26 NOTE — H&P (Signed)
Behavioral Health Medical Screening Exam  Abigail Bridges is an 24 y.o. female.  Total Time spent with patient: 30 minutes  Psychiatric Specialty Exam: Physical Exam  Nursing note and vitals reviewed. Constitutional: She is oriented to person, place, and time. She appears well-developed and well-nourished.  Cardiovascular: Normal rate.  Respiratory: Effort normal.  Musculoskeletal:        General: Normal range of motion.  Neurological: She is alert and oriented to person, place, and time.  Skin: Skin is warm.  Psychiatric: She exhibits a depressed mood.    Review of Systems  Constitutional: Negative.   HENT: Negative.   Eyes: Negative.   Respiratory: Negative.   Cardiovascular: Negative.   Gastrointestinal: Negative.   Genitourinary: Negative.   Musculoskeletal: Negative.   Skin: Negative.   Neurological: Negative.   Psychiatric/Behavioral: Positive for dysphoric mood.    Blood pressure 121/81, pulse (!) 110, temperature 98.7 F (37.1 C), temperature source Oral, resp. rate 18, SpO2 99 %.There is no height or weight on file to calculate BMI.  General Appearance: Casual and Fairly Groomed  Eye Contact:  Fair  Speech:  Clear and Coherent and Normal Rate  Volume:  Normal  Mood:  Depressed  Affect:  Congruent  Thought Process:  Coherent and Descriptions of Associations: Intact  Orientation:  Full (Time, Place, and Person)  Thought Content:  WDL  Suicidal Thoughts:  No  Homicidal Thoughts:  No  Memory:  Immediate;   Good Recent;   Good Remote;   Good  Judgement:  Fair  Insight:  Fair  Psychomotor Activity:  Normal  Concentration: Concentration: Good  Recall:  Good  Fund of Knowledge:Fair  Language: Good  Akathisia:  No  Handed:  Right  AIMS (if indicated):     Assets:  Communication Skills Desire for Improvement Financial Resources/Insurance Housing Resilience Social Support Transportation  Sleep:       Musculoskeletal: Strength & Muscle Tone: within  normal limits Gait & Station: normal Patient leans: N/A  Blood pressure 121/81, pulse (!) 110, temperature 98.7 F (37.1 C), temperature source Oral, resp. rate 18, SpO2 99 %.  Recommendations:  Based on my evaluation the patient does not appear to have an emergency medical condition.  Maryfrances Bunnell, FNP 12/26/2019, 12:53 PM

## 2020-08-26 IMAGING — DX DG CHEST 2V
3 series · 3 of 3 positions shown · non-contrast
Comparison: None.
COMPARISON: None.

Addendum:
CLINICAL DATA: Chest pain

EXAM:
CHEST - 2 VIEW

[chest pa (1 of 2)]
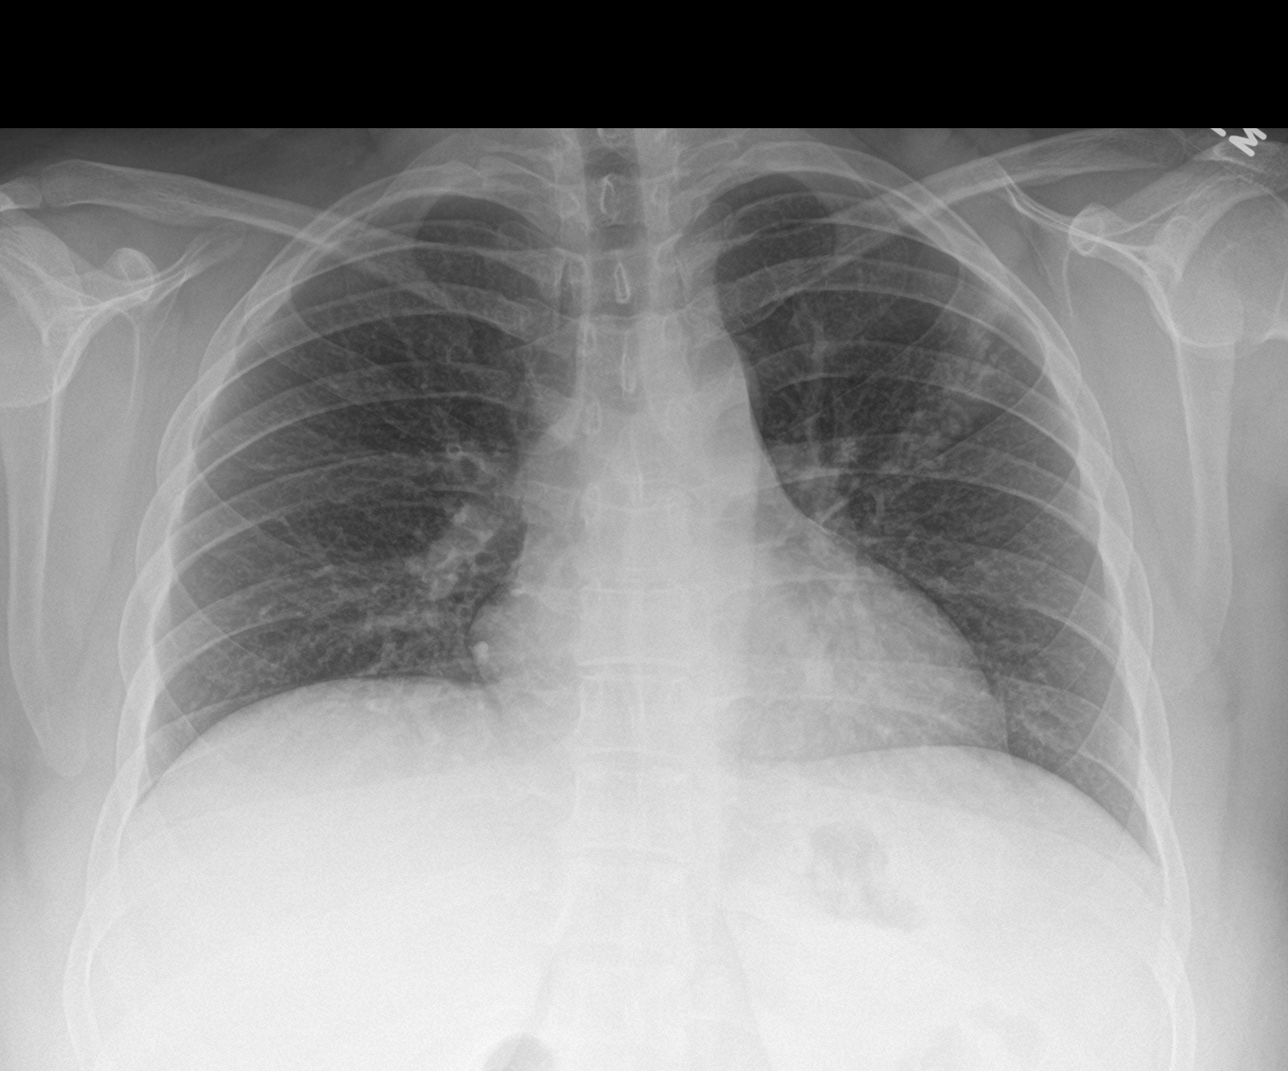

[chest lat]
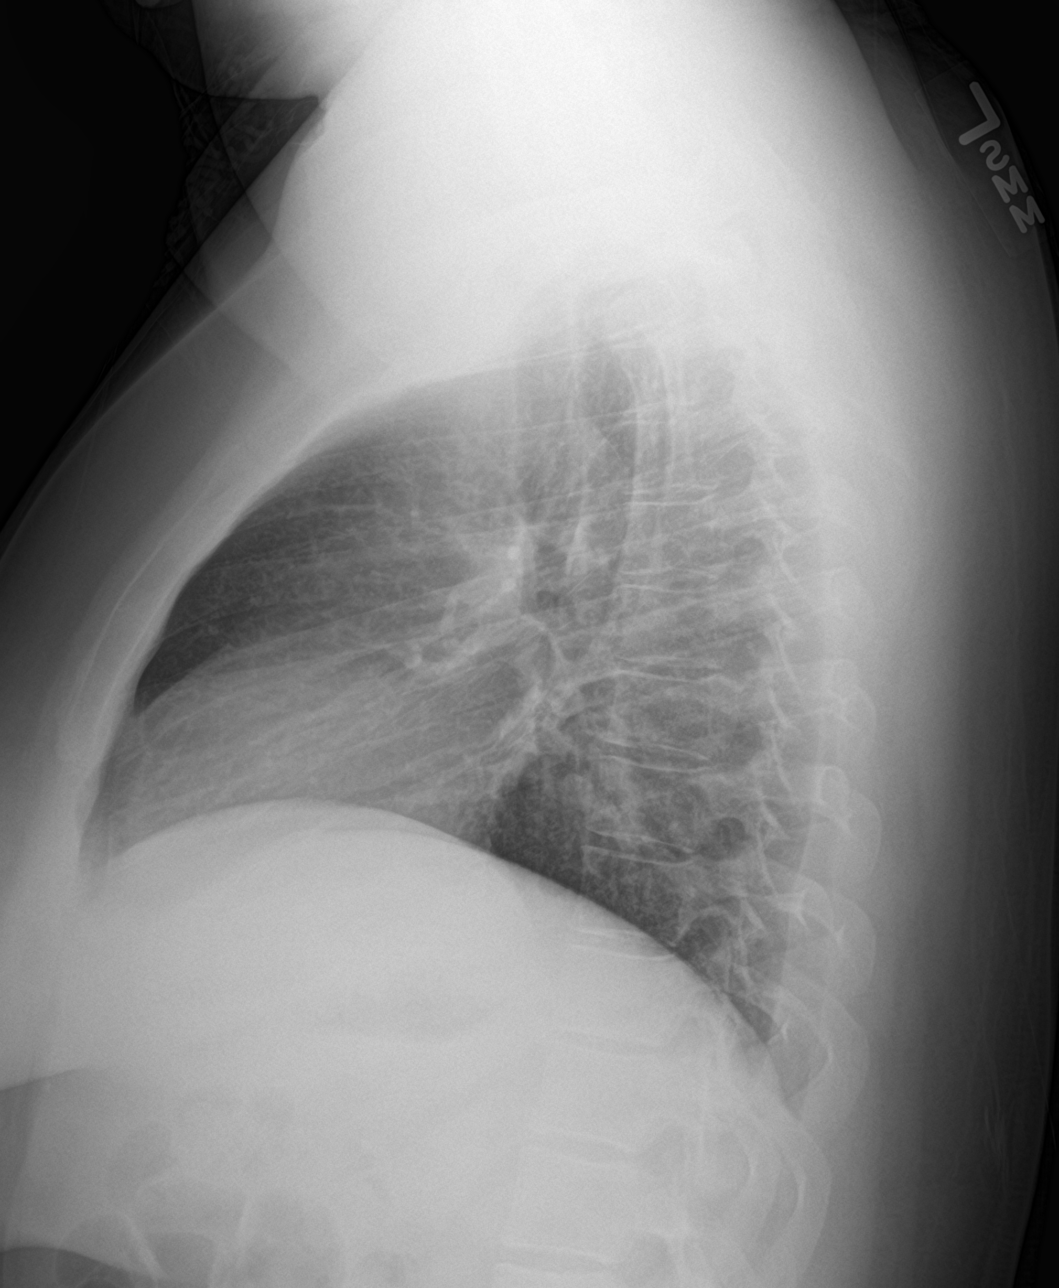

[chest pa (2 of 2)]
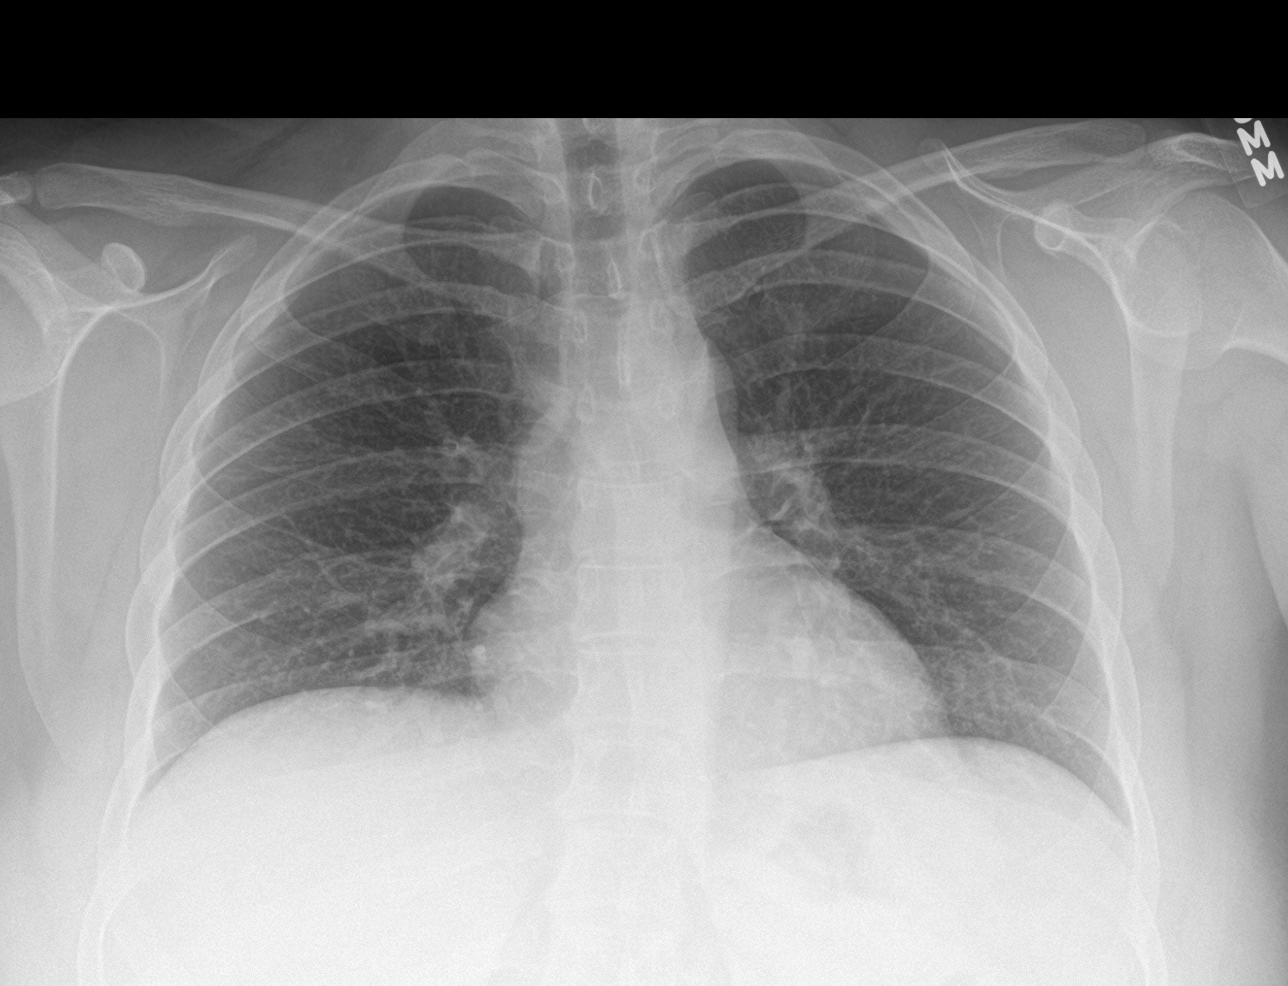

[3 of 3 positions shown; findings below may reference images not displayed]

FINDINGS: Apparent air braids are noted on the left which extend over a
portion of the left upper chest. It is difficult to exclude patchy
infiltrate in this area, although given normal lateral view, the
opacity in the left upper lobe is likely artifactual as opposed to
arising from within the lung parenchyma.

Lungs elsewhere clear. Heart size and pulmonary vascularity are
normal. No adenopathy. No bone lesions.
IMPRESSION: Suspect artifact overlying the left upper lobe; it may be prudent to
repeat frontal view after moving hair braids from left side. Lungs
elsewhere clear. Cardiac silhouette normal. No adenopathy.

These results will be called to the ordering clinician or
representative by the Radiologist Assistant, and communication
documented in the PACS or [REDACTED].

ADDENDUM:
Repeat frontal view with hair braids moved shows lungs clear
bilaterally.

*** End of Addendum ***
FINDINGS: Apparent air braids are noted on the left which extend over a
portion of the left upper chest. It is difficult to exclude patchy
infiltrate in this area, although given normal lateral view, the
opacity in the left upper lobe is likely artifactual as opposed to
arising from within the lung parenchyma.

Lungs elsewhere clear. Heart size and pulmonary vascularity are
normal. No adenopathy. No bone lesions.
IMPRESSION: Suspect artifact overlying the left upper lobe; it may be prudent to
repeat frontal view after moving hair braids from left side. Lungs
elsewhere clear. Cardiac silhouette normal. No adenopathy.

These results will be called to the ordering clinician or
representative by the Radiologist Assistant, and communication
documented in the PACS or [REDACTED].

## 2020-10-06 IMAGING — US US ABDOMEN LIMITED
1 series · 14 of 25 positions shown · non-contrast
Comparison: None.

CLINICAL DATA: Epigastric pain for 1 day. Right upper quadrant
pain.

EXAM:
ULTRASOUND ABDOMEN LIMITED RIGHT UPPER QUADRANT

[Series 1: us abdomen limited · 14 of 47 slices shown]
[im 1/47]
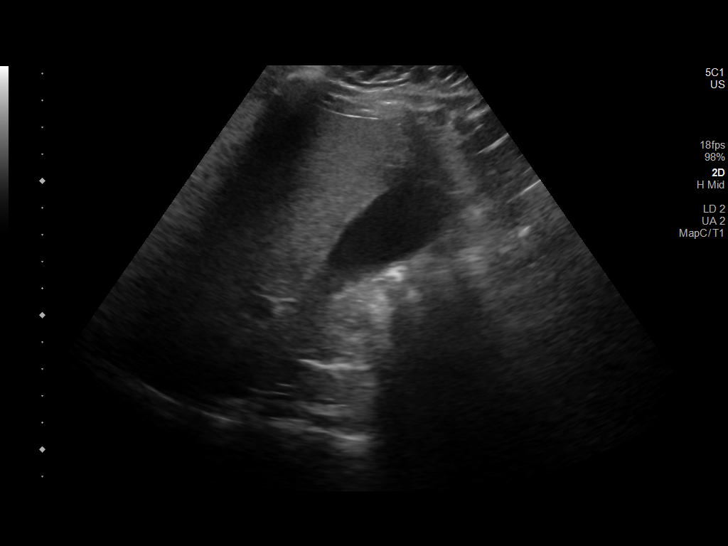
[im 4/47]
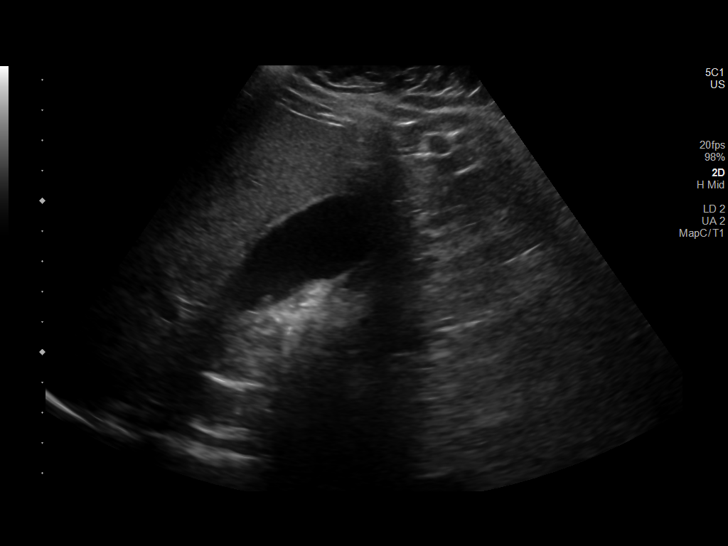
[im 8/47]
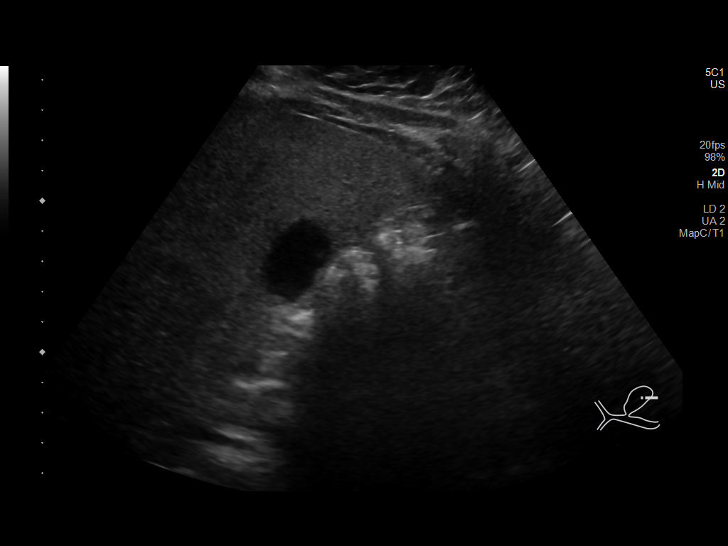
[im 12/47]
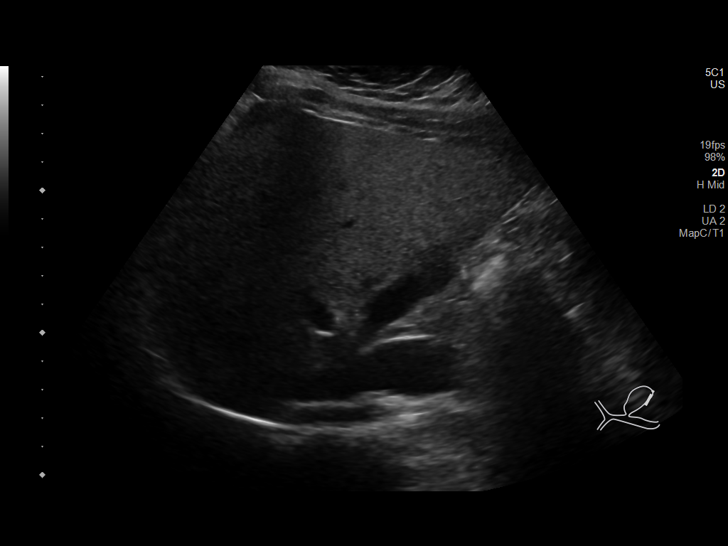
[im 16/47]
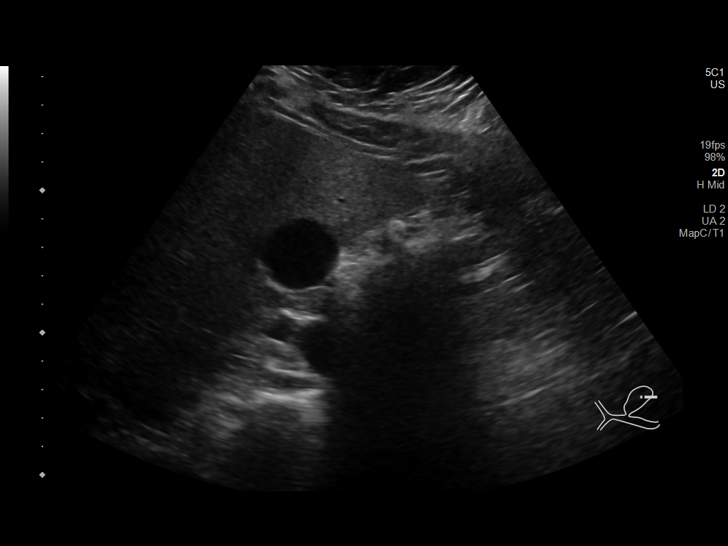
[im 18/47]
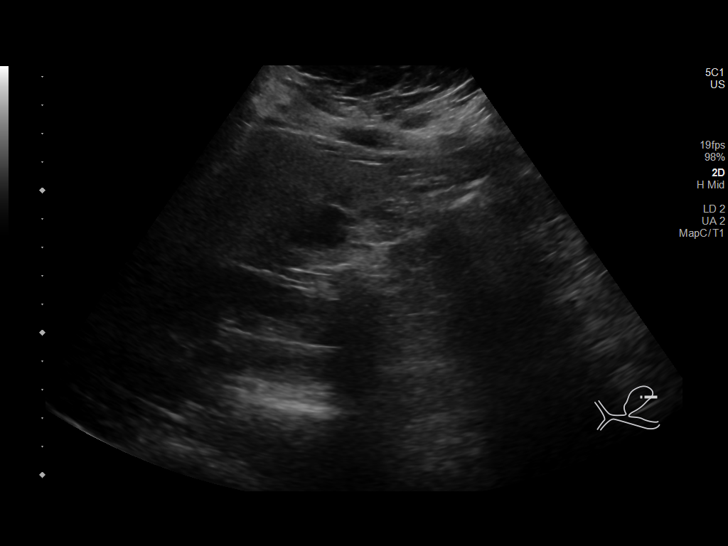
[im 22/47]
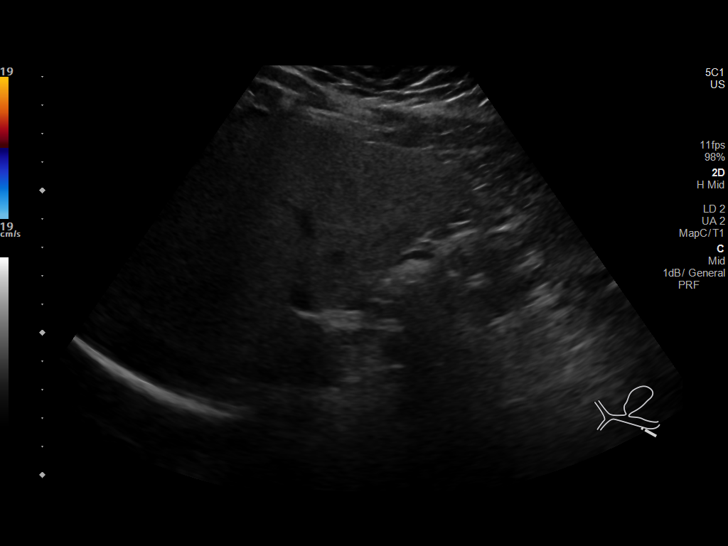
[im 25/47]
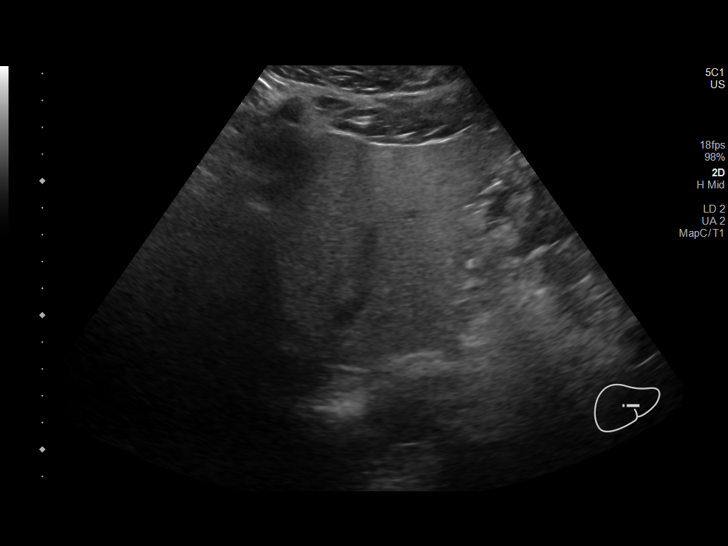
[im 29/47]
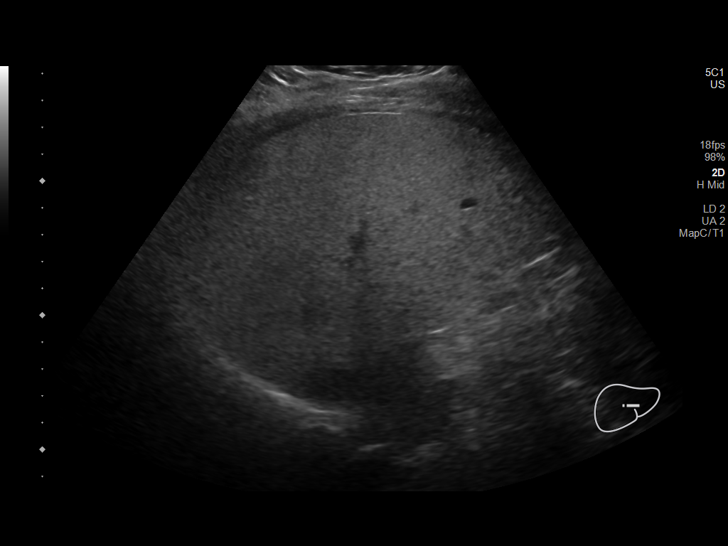
[im 31/47]
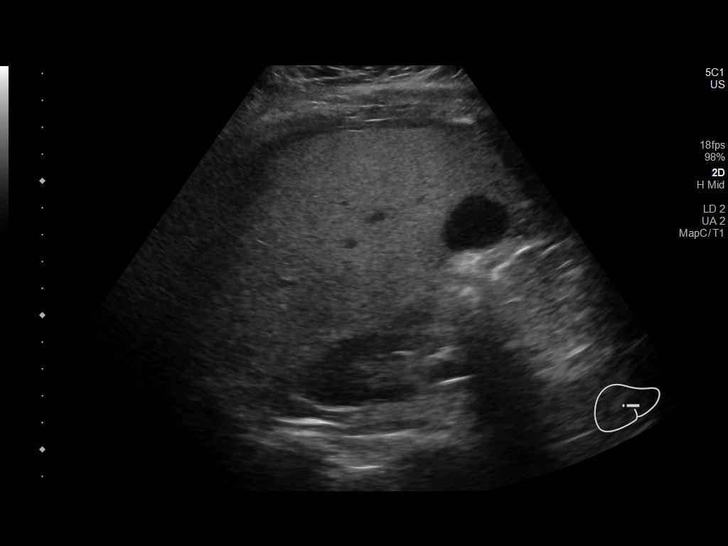
[im 35/47]
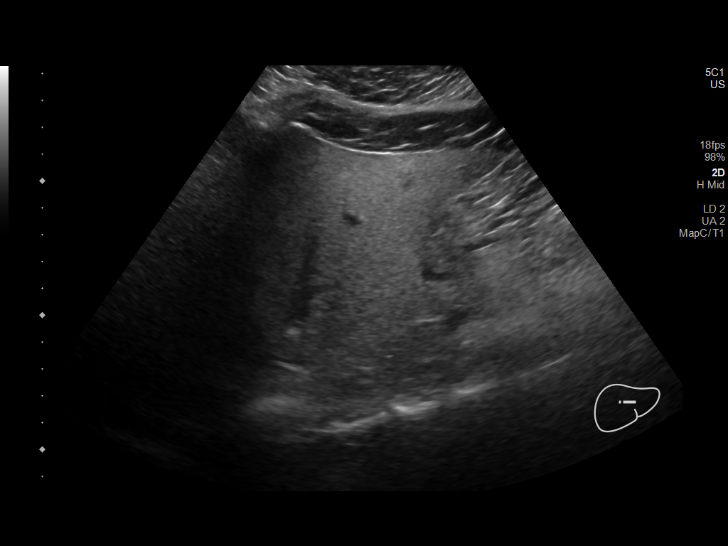
[im 39/47]
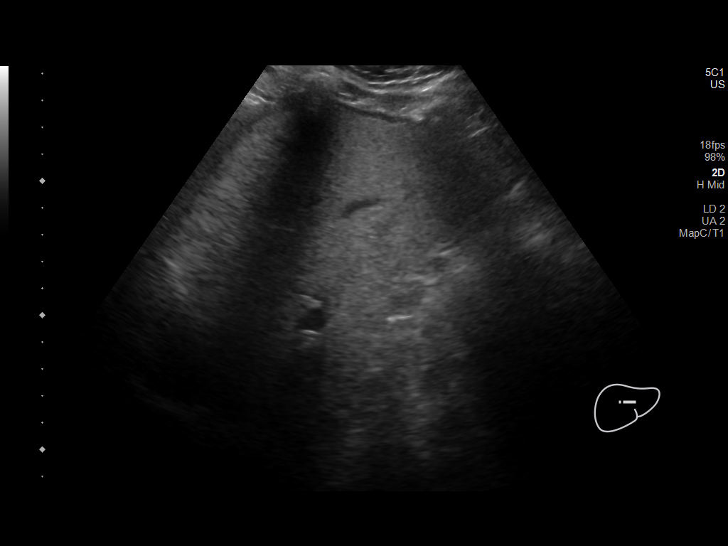
[im 43/47]
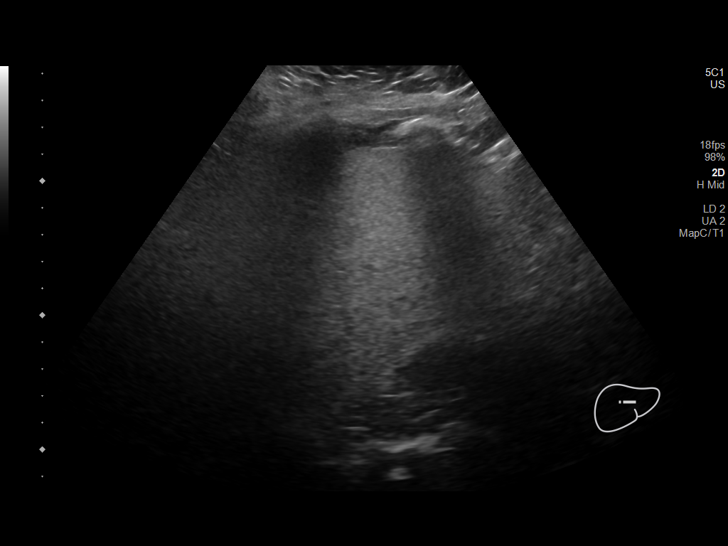
[im 47/47]
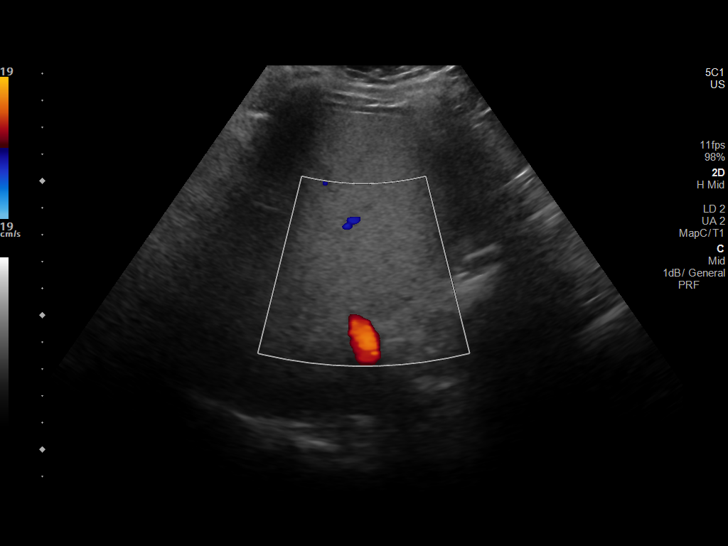

[14 of 25 positions shown; findings below may reference images not displayed]

FINDINGS: Gallbladder:

Physiologically distended. No gallstones or wall thickening
visualized. No sonographic Murphy sign noted by sonographer.

Common bile duct:

Diameter: 5 mm.

Liver:

No focal lesion identified. Heterogeneously increased in parenchymal
echogenicity. Portal vein is patent on color Doppler imaging with
normal direction of blood flow towards the liver.

Other: No right upper quadrant free fluid.
IMPRESSION: 1. Normal sonographic appearance of gallbladder and biliary tree.
2. Hepatic steatosis.

## 2020-11-21 ENCOUNTER — Encounter: Payer: Self-pay | Admitting: Emergency Medicine

## 2020-11-21 ENCOUNTER — Emergency Department
Admission: EM | Admit: 2020-11-21 | Discharge: 2020-11-21 | Disposition: A | Discharge status: Home / Self Care | Payer: BC Managed Care – PPO | Source: Home / Self Care

## 2020-11-21 ENCOUNTER — Other Ambulatory Visit: Payer: Self-pay

## 2020-11-21 DIAGNOSIS — M545 Low back pain, unspecified: Secondary | ICD-10-CM | POA: Diagnosis not present

## 2020-11-21 DIAGNOSIS — N946 Dysmenorrhea, unspecified: Secondary | ICD-10-CM

## 2020-11-21 MED ORDER — KETOROLAC TROMETHAMINE 10 MG PO TABS: 10.0000 mg | ORAL_TABLET | Freq: Four times a day (QID) | ORAL | 0 refills | Status: AC | PRN

## 2020-11-21 MED ORDER — KETOROLAC TROMETHAMINE 30 MG/ML IJ SOLN
30.0000 mg | Freq: Once | INTRAMUSCULAR | Status: AC
  Administered 2020-11-21: 30 mg via INTRAMUSCULAR

## 2020-11-21 NOTE — ED Triage Notes (Signed)
Patient states that she is having abdominal cramping which worsened with her menstrual cycle on Tuesday.  Patient is currently taken Midol.  Diarrhea, nausea, vomiting, afebrile.

## 2020-11-21 NOTE — ED Provider Notes (Signed)
[facility]    CSN: [id number] Arrival date & time: 11/21/20  1819      History   Chief Complaint Chief Complaint  Patient presents with  . Abdominal Pain    HPI Abigail Bridges is a 25 y.o. female.   Reports she is having a very painful period this week.  Reports that it started on Tuesday.  Reports that she has not had a period in the last 3 years.  Denies using birth control.  Reports that she had a normal Pap smear and gynecological visit about 3 years ago, and has not worried about it since then.  States that there is no concern for pregnancy, STDs today.  Has taken Midol today for pain with no relief.  Reports that her low back, low abdomen is very sore and that she is having some severe cramping.  Also reports that she is having heavier bleeding than she has ever had with the period in the past.  Reports that she is having quarter sized clots passing that are black.  Reports that the pain is causing nausea, vomiting.  Has had an episode of diarrhea as well.  Denies headache, cough, fever, rash, known sick contacts, other symptoms.  ROS per HPI  The history is provided by the patient.  Abdominal Pain   Past Medical History:  Diagnosis Date  . Anemia 2014  . Asthma     There are no problems to display for this patient.   History reviewed. No pertinent surgical history.  OB History   No obstetric history on file.      Home Medications    Prior to Admission medications   Medication Sig Start Date End Date Taking? Authorizing Provider  acetaminophen (TYLENOL) 500 MG tablet Take 1,500 mg by mouth 2 (two) times daily as needed for mild pain or headache.   Yes [provider]  albuterol (VENTOLIN HFA) 108 (90 Base) MCG/ACT inhaler Inhale 2 puffs into the lungs every 6 (six) hours as needed for wheezing or shortness of breath.   Yes [provider]  ketorolac (TORADOL) 10 MG tablet Take 1 tablet (10 mg total) by mouth every 6 (six)  hours as needed. 11/21/20  Yes Moshe Cipro, NP  benzonatate (TESSALON) 200 MG capsule Take one cap by mouth at bedtime as needed for cough.  May repeat in 4 to 6 hours Patient not taking: Reported on 11/23/2019 08/21/17   Lattie Haw, MD  fluticasone [facility]) 50 MCG/ACT nasal spray Place 2 sprays into both nostrils daily.    [provider]  loratadine (CLARITIN) 10 MG tablet Take 10 mg by mouth daily as needed for allergies or rhinitis.    [provider]  ondansetron (ZOFRAN ODT) 4 MG disintegrating tablet Take 1 tablet (4 mg total) by mouth every 8 (eight) hours as needed for nausea. 11/23/19   Garlon Hatchet, PA-C    Family History Family History  Problem Relation Age of Onset  . Healthy Mother   . Healthy Father     Social History Social History   Tobacco Use  . Smoking status: Never Smoker  . Smokeless tobacco: Never Used  Substance Use Topics  . Alcohol use: Yes    Comment: occasional   . Drug use: Never     Allergies   Shellfish allergy and Prednisone   Review of Systems Review of Systems  Gastrointestinal: Positive for abdominal pain.     Physical Exam Triage Vital Signs ED Triage  Vitals  Enc Vitals Group     BP 11/21/20 1835 117/83     Pulse Rate 11/21/20 1835 93     Resp --      Temp 11/21/20 1835 99.5 F (37.5 C)     Temp Source 11/21/20 1835 Oral     SpO2 11/21/20 1835 99 %     Weight --      Height --      Head Circumference --      Peak Flow --      Pain Score 11/21/20 1836 10     Pain Loc --      Pain Edu? --      Excl. in GC? --    No data found.  Updated Vital Signs BP 117/83 (BP Location: Right Arm)   Pulse 93   Temp 99.5 F (37.5 C) (Oral)   LMP 11/19/2020   SpO2 99%   Visual Acuity Right Eye Distance:   Left Eye Distance:   Bilateral Distance:    Right Eye Near:   Left Eye Near:    Bilateral Near:     Physical Exam Vitals and nursing note reviewed.  Constitutional:      General: She is  not in acute distress.    Appearance: She is well-developed. She is not ill-appearing.  HENT:     Head: Normocephalic and atraumatic.  Eyes:     Conjunctiva/sclera: Conjunctivae normal.  Cardiovascular:     Rate and Rhythm: Normal rate and regular rhythm.     Heart sounds: Normal heart sounds. No murmur heard.   Pulmonary:     Effort: Pulmonary effort is normal. No respiratory distress.     Breath sounds: Normal breath sounds.  Abdominal:     General: Bowel sounds are increased. There is no distension or abdominal bruit. There are no signs of injury.     Palpations: Abdomen is soft.     Tenderness: There is abdominal tenderness in the right lower quadrant, suprapubic area and left lower quadrant.     Hernia: No hernia is present.  Musculoskeletal:     Cervical back: Neck supple.  Skin:    General: Skin is warm and dry.     Capillary Refill: Capillary refill takes less than 2 seconds.  Neurological:     General: No focal deficit present.     Mental Status: She is alert and oriented to person, place, and time.  Psychiatric:        Mood and Affect: Mood normal.        Behavior: Behavior normal.      UC Treatments / Results  Labs (all labs ordered are listed, but only abnormal results are displayed) Labs Reviewed - No data to display  EKG   Radiology No results found.  Procedures Procedures (including critical care time)  Medications Ordered in UC Medications  ketorolac (TORADOL) 30 MG/ML injection 30 mg (30 mg Intramuscular Given 11/21/20 1846)    Initial Impression / Assessment and Plan / UC Course  I have reviewed the triage vital signs and the nursing notes.  Pertinent labs & imaging results that were available during my care of the patient were reviewed by me and considered in my medical decision making (see chart for details).    Painful menstrual periods Acute bilateral low back pain Severe menstrual cramps   Toradol 30 mg IM given in office  today Prescribed Toradol 10 mg p.o. 1 tablet every 6 hours as needed for pain and cramping  Get established with gynecology for further work-up to find the cause of abnormal periods If pain gets worse, if bleeding gets heavier and you are bleeding through more than 1 pad an hour, follow-up in the ER for further work-up tonight Verbalized understanding is agreement with treatment plan Work note provided   Final Clinical Impressions(s) / UC Diagnoses   Final diagnoses:  Painful menstrual periods  Acute bilateral low back pain without sciatica  Severe menstrual cramps     Discharge Instructions     We have given you an injection of Toradol for pain in the office today.  Do not take ibuprofen within the next 6 hours after receiving this injection.  You may take Tylenol with this medication.  I have prescribed Toradol for you to take 1 tablet every 6 hours as needed for pain and cramping.  Make an appointment for gynecology to be worked up for painful periods and abnormal periods.  If you have worsening pain, the medication is not helping you, if you are bleeding and going through more than 1 pad an hour, I recommend that you be followed up in the ER for possible ultrasound or further imaging    ED Prescriptions    Medication Sig Dispense Auth. Provider   ketorolac (TORADOL) 10 MG tablet Take 1 tablet (10 mg total) by mouth every 6 (six) hours as needed. 20 tablet Moshe Cipro, NP     PDMP not reviewed this encounter.   Moshe Cipro, NP 11/21/20 1853

## 2020-11-21 NOTE — Discharge Instructions (Addendum)
We have given you an injection of Toradol for pain in the office today.  Do not take ibuprofen within the next 6 hours after receiving this injection.  You may take Tylenol with this medication.  I have prescribed Toradol for you to take 1 tablet every 6 hours as needed for pain and cramping.  Make an appointment for gynecology to be worked up for painful periods and abnormal periods.  If you have worsening pain, the medication is not helping you, if you are bleeding and going through more than 1 pad an hour, I recommend that you be followed up in the ER for possible ultrasound or further imaging
